# Patient Record
Sex: Female | Born: 2000 | Race: White | Hispanic: No | Marital: Single | Smoking: Never smoker
Health system: Southern US, Community
[De-identification: ages and names within clinical notes are randomized; demographics above are authoritative.]

## PROBLEM LIST (undated history)

## (undated) DIAGNOSIS — R51 Headache: Secondary | ICD-10-CM

## (undated) DIAGNOSIS — F840 Autistic disorder: Secondary | ICD-10-CM

## (undated) HISTORY — DX: Headache: R51

## (undated) HISTORY — DX: Autistic disorder: F84.0

---
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
## 2022-06-21 DIAGNOSIS — J309 Allergic rhinitis, unspecified: Secondary | ICD-10-CM | POA: Diagnosis not present

## 2022-06-21 DIAGNOSIS — F840 Autistic disorder: Secondary | ICD-10-CM | POA: Diagnosis not present

## 2022-06-21 DIAGNOSIS — N939 Abnormal uterine and vaginal bleeding, unspecified: Secondary | ICD-10-CM | POA: Diagnosis not present

## 2022-06-21 DIAGNOSIS — F819 Developmental disorder of scholastic skills, unspecified: Secondary | ICD-10-CM | POA: Diagnosis not present

## 2022-06-21 DIAGNOSIS — Z1331 Encounter for screening for depression: Secondary | ICD-10-CM | POA: Diagnosis not present

## 2022-06-21 DIAGNOSIS — E669 Obesity, unspecified: Secondary | ICD-10-CM | POA: Diagnosis not present

## 2022-06-21 DIAGNOSIS — Z0000 Encounter for general adult medical examination without abnormal findings: Secondary | ICD-10-CM | POA: Diagnosis not present

## 2022-07-10 DIAGNOSIS — E669 Obesity, unspecified: Secondary | ICD-10-CM | POA: Diagnosis not present

## 2022-11-12 DIAGNOSIS — E781 Pure hyperglyceridemia: Secondary | ICD-10-CM | POA: Diagnosis not present

## 2022-11-12 DIAGNOSIS — R7303 Prediabetes: Secondary | ICD-10-CM | POA: Diagnosis not present

## 2022-12-31 ENCOUNTER — Encounter: Payer: 59 | Attending: Student | Admitting: Dietician

## 2022-12-31 ENCOUNTER — Encounter: Payer: Self-pay | Admitting: Dietician

## 2022-12-31 DIAGNOSIS — E669 Obesity, unspecified: Secondary | ICD-10-CM | POA: Insufficient documentation

## 2022-12-31 DIAGNOSIS — Z713 Dietary counseling and surveillance: Secondary | ICD-10-CM | POA: Insufficient documentation

## 2022-12-31 NOTE — Progress Notes (Signed)
Medical Nutrition Therapy  Appointment Start time:  1050  Appointment End time:  1150  Primary concerns today: Weight Loss  Referral diagnosis: E66.9 - Obesity Preferred learning style: Visual, Hands on Learning readiness: Not ready   NUTRITION ASSESSMENT   Clinical Medical Hx: Autism, Obesity Medications: N/A Labs: A1c - 6.0%, TC - 218, TGL - 715, Notable Signs/Symptoms: N/A   Lifestyle & Dietary Hx Pt mother, Chauncey Mann, and infant sister, Arnold Long, present during appointment Mother reports goal of lowering pt A1c and lipids, prevent the need for medication. Mother reports pt is still in school, plans to graduate this year. Mother reports switching pt from regular soda to ZERO sugar sodas and Kool-Aid, stop buying Pop-Tarts, choosing better cereal options. Mother reports pt is a carb-a-holic, has been getting low-carb bagels, pastas, etc. Mother reports pt has food aversions to vegetables, pt likes most fruits (strawberries, grapes, apples, peaches, pears). Mother reports taking pt grocery shopping, states they are reading nutrition labels together. Pt reports typically eating 3 meals a day, states they eat more junkfood when staying with their dad (on weekends).   Estimated daily fluid intake: >48 oz Supplements: Previously taking MVI Sleep: Sleeps well Stress / self-care: Low stress Current average weekly physical activity: ADLs, walk ~30 minutes daily   24-Hr Dietary Recall First Meal: Bowl of cereal (S'mores), Skim milk Snack: Muffin Second Meal: None Snack: none Third Meal: Chicken wings, mashed potatoes, corn, green beans Snack: none Beverages: Milk, water    NUTRITION DIAGNOSIS  [place]-2.2 Altered nutrition-related laboratory As related to Elevated A1c, HLD.  As evidenced by A1c of 6.0%, Cholesterol of 218 mg/dL, and Triglycerides of 960 mg/dL.   NUTRITION INTERVENTION  Nutrition education (E-1) on the following topics:  Educated patient on the two components of  energy balance: Energy in (calories), and energy out (activity). Explain the role of negative energy balance in weight loss. Discussed options with patient to achieve a negative energy balance and how to best control energy in and energy out to accommodate their lifestyle. Educated pt on the importance of protein intake and prioritizing having a source of protein at every meal.  Handouts Provided Include  Protein Food List  Learning Style & Readiness for Change Teaching method utilized: Visual & Auditory  Demonstrated degree of understanding via: Teach Back  Barriers to learning/adherence to lifestyle change: Autism Spectrum Disorder  Goals Established by Pt Restart taking your daily multivitamin gummies! Continue to replace high sugar, high carb meals with low sugar, sugar free, or low-carb replacement items! Have Oikos "Triple Zero", Danon "Light n Fit" Greek Yogurt  Look into high protein baked goods (pancakes, muffins) Choose reduced fat cheeses at all times. Try "Food For Life" Ezekiel cinnamon raisin bread! Try Carnation "Breakfast Essentials" to add to Fairlife 2% milk for more nutritious hot chocolate/chocolate milk options.   MONITORING & EVALUATION Dietary intake, weekly physical activity, and protein intake in 2-3 months.  Next Steps  Patient is to follow up with RD.

## 2022-12-31 NOTE — Patient Instructions (Addendum)
Restart taking your daily multivitamin gummies!  Your daily goal for water is 64 oz!!  Continue to replace high sugar, high carb meals with low sugar, sugar free, or low-carb replacement items!  Have Oikos "Triple Zero", Danon "Light n Fit" Greek Yogurt   Look into high protein baked goods (pancakes, muffins, waffles) recipes!  Choose reduced fat cheeses at all times.  Try "Food For Life" Ezekiel cinnamon raisin bread!  Try Carnation "Breakfast Essentials" to add to Fairlife 2% milk for more nutritious hot chocolate/chocolate milk options.

## 2023-03-19 ENCOUNTER — Ambulatory Visit: Payer: 59 | Admitting: Dietician

## 2023-04-04 ENCOUNTER — Encounter: Payer: MEDICAID | Attending: Student | Admitting: Dietician

## 2023-04-04 ENCOUNTER — Encounter: Payer: Self-pay | Admitting: Dietician

## 2023-04-04 DIAGNOSIS — E669 Obesity, unspecified: Secondary | ICD-10-CM | POA: Diagnosis present

## 2023-04-04 NOTE — Progress Notes (Signed)
Medical Nutrition Therapy  Appointment Start time:  [phone]  Appointment End time:  1520  Primary concerns today: Weight Loss  Referral diagnosis: E66.9 - Obesity Preferred learning style: Visual, Hands on Learning readiness: Not ready   NUTRITION ASSESSMENT   Clinical Medical Hx: Autism, Obesity Medications: N/A Labs: A1c - 6.0%, TC - 218, TGL - 715, Notable Signs/Symptoms: N/A   Lifestyle & Dietary Hx Pt mother, Chauncey Mann, present during appointment Mother reports diet has been difficult to keep on track during holidays and special occasions. Mother reports busy schedule makes it difficult to consistnetly cook at home. Mother reports a goal of trying to reduce processed food (Stouffers mac and cheese, Hot Pockets) and Chick-Fil-A. Mother states pt has been having Activia daily with breakfast, eating more fruits (grapes, berries) and sugar free desserts, choosing whole grain/high protein breads. Mother reports pt will not eat vegetables at all, only like cucumbers Mother reports pt plays basketball for [employer], will be walking more in the Spring and has another [employer] event in April-May.   Estimated daily fluid intake: >48 oz Supplements: Gummy MVI Sleep: Sleeps well Stress / self-care: Low stress Current average weekly physical activity: ADLs, walk ~30 minutes daily   24-Hr Dietary Recall First Meal: Dave's Bagel with cream cheese Snack: Granola bar Second Meal: Macaroni and cheese, yogurt, diet coke Snack:  Third Meal: Spaghetti w/ marinara, Sweet tea Snack: none Beverages: Milk, water   NUTRITION DIAGNOSIS  [place]-2.2 Altered nutrition-related laboratory As related to Elevated A1c, HLD.  As evidenced by A1c of 6.0%, Cholesterol of 218 mg/dL, and Triglycerides of 562 mg/dL.   NUTRITION INTERVENTION  Nutrition education (E-1) on the following topics:  Educated patient on the two components of energy balance: Energy in (calories), and energy out  (activity). Explain the role of negative energy balance in weight loss. Discussed options with patient to achieve a negative energy balance and how to best control energy in and energy out to accommodate their lifestyle. Educated pt on the importance of protein intake and prioritizing having a source of protein at every meal.  Handouts Provided Include  Protein Food List  Learning Style & Readiness for Change Teaching method utilized: Visual & Auditory  Demonstrated degree of understanding via: Teach Back  Barriers to learning/adherence to lifestyle change: Autism Spectrum Disorder  Goals Established by Pt Try Skyr yogurt (Siggi's, [place] Provision) instead of greek yogurt. When shopping for processed foods, look for less than 140 mg of sodium per serving and aim for less than 500-600 mg of sodium per meal. Have eggs as a source of protein when you don't eat your meat at meals. Try english muffins as a good replacement for bagels! Try GoGo Squeeze yogurt for a higher protein snack, and HappyTot baby food pouches for a fruit/vegetable intake. Take packs of Splenda with you when you go out to eat to add to unsweet tea instead of having sweet tea.  MONITORING & EVALUATION Dietary intake, weekly physical activity, and lab values PRN.  Next Steps  Patient is to schedule follow up with RD after next lab draw

## 2023-04-04 NOTE — Patient Instructions (Addendum)
Try Skyr yogurt (Siggi's, [place] Provision) instead of greek yogurt.  When shopping for processed foods, look for less than 140 mg of sodium per serving and aim for less than 500-600 mg of sodium per meal.  Have eggs as a source of protein when you don't eat your meat at meals.  Try english muffins as a good replacement for bagels!  Try GoGo Squeeze yogurt for a higher protein snack, and HappyTot baby food pouches for a fruit/vegetable intake.  Take packs of Splenda with you when you go out to eat to add to unsweet tea instead of having sweet tea.
